# Patient Record
Sex: Male | Born: 2000
Health system: Southern US, Community
[De-identification: ages and names within clinical notes are randomized; demographics above are authoritative.]

## PROBLEM LIST (undated history)

## (undated) DIAGNOSIS — H6690 Otitis media, unspecified, unspecified ear: Secondary | ICD-10-CM

## (undated) HISTORY — DX: Otitis media, unspecified, unspecified ear: H66.90

## (undated) HISTORY — PX: FRACTURE SURGERY: SHX138

## (undated) HISTORY — PX: TYMPANOSTOMY TUBE PLACEMENT: SHX32

---
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
## 2018-06-27 ENCOUNTER — Emergency Department (HOSPITAL_COMMUNITY): Payer: 59

## 2018-06-27 ENCOUNTER — Encounter (HOSPITAL_COMMUNITY): Payer: Self-pay | Admitting: Emergency Medicine

## 2018-06-27 ENCOUNTER — Other Ambulatory Visit: Payer: Self-pay

## 2018-06-27 ENCOUNTER — Emergency Department (HOSPITAL_COMMUNITY)
Admission: EM | Admit: 2018-06-27 | Discharge: 2018-06-27 | Disposition: A | Discharge status: Home / Self Care | Payer: 59 | Attending: Emergency Medicine | Admitting: Emergency Medicine

## 2018-06-27 DIAGNOSIS — N200 Calculus of kidney: Secondary | ICD-10-CM | POA: Diagnosis not present

## 2018-06-27 DIAGNOSIS — R1011 Right upper quadrant pain: Secondary | ICD-10-CM | POA: Diagnosis present

## 2018-06-27 DIAGNOSIS — Z79899 Other long term (current) drug therapy: Secondary | ICD-10-CM | POA: Diagnosis not present

## 2018-06-27 DIAGNOSIS — F17200 Nicotine dependence, unspecified, uncomplicated: Secondary | ICD-10-CM | POA: Diagnosis not present

## 2018-06-27 LAB — COMPREHENSIVE METABOLIC PANEL
ALT: 14 U/L (ref 0–44)
AST: 18 U/L (ref 15–41)
Albumin: 5.1 g/dL — ABNORMAL HIGH (ref 3.5–5.0)
Alkaline Phosphatase: 98 U/L (ref 38–126)
Anion gap: 17 — ABNORMAL HIGH (ref 5–15)
BUN: 9 mg/dL (ref 6–20)
CO2: 23 mmol/L (ref 22–32)
Calcium: 9.3 mg/dL (ref 8.9–10.3)
Chloride: 97 mmol/L — ABNORMAL LOW (ref 98–111)
Creatinine, Ser: 1.09 mg/dL (ref 0.61–1.24)
GFR calc Af Amer: >60 mL/min (ref >60)
GFR calc non Af Amer: >60 mL/min (ref >60)
Glucose, Bld: 93 mg/dL (ref 70–99)
Potassium: 3.4 mmol/L — ABNORMAL LOW (ref 3.5–5.1)
Sodium: 137 mmol/L (ref 135–145)
Total Bilirubin: 1.3 mg/dL — ABNORMAL HIGH (ref 0.3–1.2)
Total Protein: 8 g/dL (ref 6.5–8.1)

## 2018-06-27 LAB — CBC WITH DIFFERENTIAL/PLATELET
Abs Immature Granulocytes: 0.08 10*3/uL — ABNORMAL HIGH (ref 0.00–0.07)
Basophils Absolute: 0 10*3/uL (ref 0.0–0.1)
Basophils Relative: 0 %
Eosinophils Absolute: 0.1 10*3/uL (ref 0.0–0.5)
Eosinophils Relative: 0 %
HCT: 47.1 % (ref 39.0–52.0)
Hemoglobin: 16.6 g/dL (ref 13.0–17.0)
Immature Granulocytes: 0 %
Lymphocytes Relative: 4 %
Lymphs Abs: 0.9 10*3/uL (ref 0.7–4.0)
MCH: 32.2 pg (ref 26.0–34.0)
MCHC: 35.2 g/dL (ref 30.0–36.0)
MCV: 91.3 fL (ref 80.0–100.0)
Monocytes Absolute: 1.4 10*3/uL — ABNORMAL HIGH (ref 0.1–1.0)
Monocytes Relative: 7 %
Neutro Abs: 17.1 10*3/uL — ABNORMAL HIGH (ref 1.7–7.7)
Neutrophils Relative %: 89 %
Platelets: 275 10*3/uL (ref 150–400)
RBC: 5.16 MIL/uL (ref 4.22–5.81)
RDW: 12.1 % (ref 11.5–15.5)
WBC: 19.6 10*3/uL — ABNORMAL HIGH (ref 4.0–10.5)
nRBC: 0 % (ref 0.0–0.2)

## 2018-06-27 LAB — URINALYSIS, ROUTINE W REFLEX MICROSCOPIC
Bilirubin Urine: NEGATIVE
Glucose, UA: NEGATIVE mg/dL
Ketones, ur: 80 mg/dL — AB
Nitrite: NEGATIVE
Protein, ur: 100 mg/dL — AB
RBC / HPF: >50 RBC/hpf — ABNORMAL HIGH (ref 0–5)
Specific Gravity, Urine: 1.024 (ref 1.005–1.030)
pH: 7 (ref 5.0–8.0)

## 2018-06-27 MED ORDER — HYDROCODONE-ACETAMINOPHEN 5-325 MG PO TABS: 1.0000 | ORAL_TABLET | Freq: Four times a day (QID) | ORAL | 0 refills | Status: AC | PRN

## 2018-06-27 MED ORDER — ONDANSETRON 4 MG PO TBDP: 4.0000 mg | ORAL_TABLET | Freq: Three times a day (TID) | ORAL | 0 refills | Status: AC | PRN

## 2018-06-27 MED ORDER — MORPHINE SULFATE (PF) 4 MG/ML IV SOLN
4.0000 mg | Freq: Once | INTRAVENOUS | Status: AC
  Administered 2018-06-27: 4 mg via INTRAVENOUS
  Filled 2018-06-27: qty 1

## 2018-06-27 MED ORDER — SULFAMETHOXAZOLE-TRIMETHOPRIM 800-160 MG PO TABS: 1.0000 | ORAL_TABLET | Freq: Two times a day (BID) | ORAL | 0 refills | Status: AC

## 2018-06-27 MED ORDER — SODIUM CHLORIDE 0.9 % IV BOLUS
1000.0000 mL | Freq: Once | INTRAVENOUS | Status: AC
  Administered 2018-06-27: 1000 mL via INTRAVENOUS

## 2018-06-27 MED ORDER — KETOROLAC TROMETHAMINE 10 MG PO TABS: 10.0000 mg | ORAL_TABLET | Freq: Three times a day (TID) | ORAL | 0 refills | Status: AC | PRN

## 2018-06-27 MED ORDER — SODIUM CHLORIDE 0.9 % IV SOLN
2.0000 g | Freq: Once | INTRAVENOUS | Status: AC
  Administered 2018-06-27: 2 g via INTRAVENOUS
  Filled 2018-06-27: qty 20

## 2018-06-27 MED ORDER — KETOROLAC TROMETHAMINE 30 MG/ML IJ SOLN
15.0000 mg | Freq: Once | INTRAMUSCULAR | Status: AC
  Administered 2018-06-27: 15 mg via INTRAVENOUS
  Filled 2018-06-27: qty 1

## 2018-06-27 MED ORDER — ONDANSETRON HCL 4 MG/2ML IJ SOLN
4.0000 mg | Freq: Once | INTRAMUSCULAR | Status: AC
  Administered 2018-06-27: 4 mg via INTRAVENOUS
  Filled 2018-06-27: qty 2

## 2018-06-27 MED ORDER — TAMSULOSIN HCL 0.4 MG PO CAPS: 0.4000 mg | ORAL_CAPSULE | Freq: Every day | ORAL | 0 refills | Status: AC

## 2018-06-27 MED ORDER — KETOROLAC TROMETHAMINE 30 MG/ML IJ SOLN
30.0000 mg | Freq: Once | INTRAMUSCULAR | Status: AC
  Administered 2018-06-27: 30 mg via INTRAVENOUS
  Filled 2018-06-27: qty 1

## 2018-06-27 NOTE — ED Triage Notes (Signed)
Dx 'd with kidney stone at Urgent care in [place]  Told to follow with Dr Wolfgang Phoenix  Has not now with L flank pain  Here for eval

## 2018-06-27 NOTE — ED Provider Notes (Signed)
[facility] EMERGENCY DEPARTMENT Provider Note   CSN: [id number] Arrival date & time: 06/27/18  1311    History   Chief Complaint Chief Complaint  Patient presents with   Flank Pain    HPI Randy HumphreyWesley K Williams is a 18 y.o. male past medical depression, chronic otitis media who presents for evaluation of right flank pain that began this morning.  Patient reports that he was down in [place] 2 weeks ago and was told he had a right-sided kidney stone.  He states that he was supposed to get results and was going to follow-up but he states he never got the results and did not follow-up with anyone.  Patient states that he had been doing well until this morning when he started having acute onset of right flank pain.  ED arrival, he states that pain is now more in the right lower abdomen.  He reports some associated nausea/vomiting.  He has not noted any hematuria or dysuria.  He has not noted any fever.  She denies any difficulty breathing, testicular pain or swelling.      The history is provided by the patient.    Past Medical History:  Diagnosis Date   Chronic otitis media     Patient Active Problem List   Diagnosis Date Noted   Depression 03/05/2013   Other malaise and fatigue 03/05/2013   Insomnia 03/05/2013    Past Surgical History:  Procedure Laterality Date   FRACTURE SURGERY     TYMPANOSTOMY TUBE PLACEMENT          Home Medications    Prior to Admission medications   Medication Sig Start Date End Date Taking? Authorizing Provider  amphetamine-dextroamphetamine (ADDERALL) 10 MG tablet Take 10 mg by mouth every morning. 05/08/18  Yes [provider]  citalopram (CELEXA) 20 MG tablet Take 20 mg by mouth at bedtime.  09/28/16  Yes [provider]  ibuprofen (ADVIL) 200 MG tablet Take 400 mg by mouth every 6 (six) hours as needed for mild pain or moderate pain.   Yes [provider]  HYDROcodone-acetaminophen (NORCO/VICODIN) 5-325 MG  tablet Take 1-2 tablets by mouth every 6 (six) hours as needed. 06/27/18   [name], [name], PA-C  ketorolac (TORADOL) 10 MG tablet Take 1 tablet (10 mg total) by mouth every 8 (eight) hours as needed. 06/27/18   [name], Annisten Manchester A, PA-C  ondansetron (ZOFRAN ODT) 4 MG disintegrating tablet Take 1 tablet (4 mg total) by mouth every 8 (eight) hours as needed for nausea or vomiting. 06/27/18   [name], [name], PA-C  sulfamethoxazole-trimethoprim (BACTRIM DS) 800-160 MG tablet Take 1 tablet by mouth 2 (two) times daily for 7 days. 06/27/18 07/04/18  [name], [name], PA-C  tamsulosin (FLOMAX) 0.4 MG CAPS capsule Take 1 capsule (0.4 mg total) by mouth daily for 5 days. 06/27/18 07/02/18  [name], [name], PA-C    Family History Family History  Problem Relation Age of Onset   Heart attack Other    Depression Other    Heart attack Other    Depression Mother    Alcohol abuse Father    Anxiety disorder Brother    Alcohol abuse Paternal Aunt    Depression Maternal Grandmother    Alcohol abuse Paternal Grandfather     Social History Social History   Tobacco Use   Smoking status: Current Every Day Smoker   Smokeless tobacco: Never Used  Substance Use Topics   Alcohol use: No   Drug use: No  Allergies   Patient has no known allergies.   Review of Systems Review of Systems  Constitutional: Negative for fever.  Respiratory: Negative for cough and shortness of breath.   Cardiovascular: Negative for chest pain.  Gastrointestinal: Positive for abdominal pain, nausea and vomiting.  Genitourinary: Positive for flank pain. Negative for dysuria and hematuria.  Neurological: Negative for headaches.  All other systems reviewed and are negative.    Physical Exam Updated Vital Signs BP (!) 121/91 (BP Location: Right Arm)    Pulse 76    Temp 98.3 F (36.8 C) (Oral)    Resp 20    Ht 5\' 8"  (1.727 m)    Wt 51.7 kg    SpO2 97%    BMI 17.33 kg/m   Physical Exam Vitals signs and  nursing note reviewed.  Constitutional:      Appearance: Normal appearance. He is well-developed.     Comments: Appears uncomfortable, actively vomiting.   HENT:     Head: Normocephalic and atraumatic.  Eyes:     General: Lids are normal.     Conjunctiva/sclera: Conjunctivae normal.     Pupils: Pupils are equal, round, and reactive to light.  Neck:     Musculoskeletal: Full passive range of motion without pain.  Cardiovascular:     Rate and Rhythm: Normal rate and regular rhythm.     Pulses: Normal pulses.     Heart sounds: Normal heart sounds. No murmur. No friction rub. No gallop.   Pulmonary:     Effort: Pulmonary effort is normal.     Breath sounds: Normal breath sounds.     Comments: Lungs clear to auscultation bilaterally.  Symmetric chest rise.  No wheezing, rales, rhonchi. Abdominal:     Palpations: Abdomen is soft. Abdomen is not rigid.     Tenderness: There is abdominal tenderness in the right lower quadrant. There is no guarding.     Comments: Abdomen is soft, nondistended.  Diffuse tenderness palpation noted to right lower quadrant with no focal point.  No tenderness noted at McBurney's point.  Musculoskeletal: Normal range of motion.  Skin:    General: Skin is warm and dry.     Capillary Refill: Capillary refill takes less than 2 seconds.  Neurological:     Mental Status: He is alert and oriented to person, place, and time.  Psychiatric:        Speech: Speech normal.      ED Treatments / Results  Labs (all labs ordered are listed, but only abnormal results are displayed) Labs Reviewed  URINALYSIS, ROUTINE W REFLEX MICROSCOPIC - Abnormal; Notable for the following components:      Result Value   APPearance CLOUDY (*)    Hgb urine dipstick MODERATE (*)    Ketones, ur 80 (*)    Protein, ur 100 (*)    Leukocytes,Ua TRACE (*)    RBC / HPF >50 (*)    Bacteria, UA RARE (*)    All other components within normal limits  COMPREHENSIVE METABOLIC PANEL - Abnormal;  Notable for the following components:   Potassium 3.4 (*)    Chloride 97 (*)    Albumin 5.1 (*)    Total Bilirubin 1.3 (*)    Anion gap 17 (*)    All other components within normal limits  CBC WITH DIFFERENTIAL/PLATELET - Abnormal; Notable for the following components:   WBC 19.6 (*)    Neutro Abs 17.1 (*)    Monocytes Absolute 1.4 (*)  Abs Immature Granulocytes 0.08 (*)    All other components within normal limits  URINE CULTURE    EKG None  Radiology Ct Renal Stone Study  Result Date: 06/27/2018 CLINICAL DATA:  Right-sided kidney stone by history with hematuria and right-sided pain, initial encounter EXAM: CT ABDOMEN AND PELVIS WITHOUT CONTRAST TECHNIQUE: Multidetector CT imaging of the abdomen and pelvis was performed following the standard protocol without IV contrast. COMPARISON:  None. FINDINGS: Lower chest: No acute abnormality. Hepatobiliary: No focal liver abnormality is seen. No gallstones, gallbladder wall thickening, or biliary dilatation. Pancreas: Unremarkable. No pancreatic ductal dilatation or surrounding inflammatory changes. Spleen: Normal in size without focal abnormality. Adrenals/Urinary Tract: Adrenal glands are within normal limits. The left kidney is unremarkable. No obstructive changes are seen. The bladder is decompressed. Right kidney demonstrates hydronephrosis and hydroureter which extends to the level of the right distal ureter where a 4 mm stone is identified causing the hydronephrosis. Stomach/Bowel: Stomach is within normal limits. Appendix appears normal. No evidence of bowel wall thickening, distention, or inflammatory changes. Vascular/Lymphatic: No significant vascular findings are present. No enlarged abdominal or pelvic lymph nodes. Reproductive: Prostate is unremarkable. Other: No abdominal wall hernia or abnormality. No abdominopelvic ascites. Musculoskeletal: No acute or significant osseous findings. IMPRESSION: 4 mm distal right ureteral stone causing  hydronephrosis and hydroureter. No other focal abnormality is noted. Electronically Signed   By: Inez Williams M.D.   On: 06/27/2018 15:13    Procedures Procedures (including critical care time)  Medications Ordered in ED Medications  ondansetron (ZOFRAN) injection 4 mg (4 mg Intravenous Given 06/27/18 1449)  morphine 4 MG/ML injection 4 mg (4 mg Intravenous Given 06/27/18 1448)  ketorolac (TORADOL) 30 MG/ML injection 30 mg (30 mg Intravenous Given 06/27/18 1635)  sodium chloride 0.9 % bolus 1,000 mL (1,000 mLs Intravenous New Bag/Given 06/27/18 1635)  ketorolac (TORADOL) 30 MG/ML injection 15 mg (15 mg Intravenous Given 06/27/18 1833)  cefTRIAXone (ROCEPHIN) 2 g in sodium chloride 0.9 % 100 mL IVPB (2 g Intravenous New Bag/Given 06/27/18 1834)     Initial Impression / Assessment and Plan / ED Course  I have reviewed the triage vital signs and the nursing notes.  Pertinent labs & imaging results that were available during my care of the patient were reviewed by me and considered in my medical decision making (see chart for details).        18 year old male who presents for evaluation of right abdominal pain that began today.  Recently diagnosed with kidney stones 2 weeks ago but has not followed up.  States he had been doing well until today when symptoms returned.  Additionally is having some nausea/vomiting.  No hematuria or dysuria that he has noted. Patient is afebrile, signs stable.  He does appear uncomfortable and is actively vomiting on exam.  Patient with diffuse tenderness noted to the right lower quadrant no focal point.  Concern for kidney stone.  Also consider appendicitis but lower suspicion given acute onset of symptoms.  Will plan for labs, UA, renal study.  CBC shows leukocytosis of 19.6.  CT renal study shows 4 mm distal right ureteral stone causing hydronephrosis and hydroureter which extends to the level of the right distal ureter. UA shows moderate Hgb, trace leuks, pyuria. Given  concerns for possible infected stone, will touch base with Urology.   Discussed patient with Dr. Diona Fanti (Urology). Recommends urine culture and treating with bactrim bid. Given patient's reassuring vitals and improvement in pain, feels that patient is ok  to discharge. Plan to follow-up with Urology tomorrow.   Updated patient on plan. He is agreeable. Reports pain is 4/10. Repeat abdominal exam is improved. He has been able to tolerate PO in the ED. No vomiting. At this time, patient exhibits no emergent life-threatening condition that require further evaluation in ED or admission. Discussed patient with Dr. Particia NearingHaviland who is agreeable.  Plan to send home with Zofran, Flomax.  Additionally, will give a short course of pain medication given acute nature of kidney stone.  Patient reviewed on PMP with no recent narcotic prescriptions.  Patient had ample opportunity for questions and discussion. All patient's questions were answered with full understanding. Strict return precautions discussed. Patient expresses understanding and agreement to plan.   Portions of this note were generated with Scientist, clinical (histocompatibility and immunogenetics)Dragon dictation software. Dictation errors may occur despite best attempts at proofreading.   Final Clinical Impressions(s) / ED Diagnoses   Final diagnoses:  Kidney stone    ED Discharge Orders         Ordered    ondansetron (ZOFRAN ODT) 4 MG disintegrating tablet  Every 8 hours PRN     06/27/18 1952    HYDROcodone-acetaminophen (NORCO/VICODIN) 5-325 MG tablet  Every 6 hours PRN     06/27/18 1952    ketorolac (TORADOL) 10 MG tablet  Every 8 hours PRN     06/27/18 1952    tamsulosin (FLOMAX) 0.4 MG CAPS capsule  Daily     06/27/18 1952    sulfamethoxazole-trimethoprim (BACTRIM DS) 800-160 MG tablet  2 times daily     06/27/18 1952           Rosana HoesLayden, Dajae Kizer A, PA-C 06/27/18 Azucena Fallen1958    Haviland, Julie, MD 06/27/18 2059

## 2018-06-27 NOTE — Discharge Instructions (Addendum)
Take Zofran as needed for nausea.  Take Toradol for pain.  You can also take Norco for severe breakthrough pain.  Use Flomax as directed.  Take antibiotics as directed. Please take all of your antibiotics until finished.  As we discussed, it is very important for you to follow-up with referred urologist.  Please call their office tomorrow morning.  They will plan to see you in [facility] tomorrow.  Return the emergency department for any fever, worsening pain, vomiting, blood in stools, vomiting blood or any other worsening or concerning symptoms.

## 2018-06-29 LAB — URINE CULTURE
Culture: NO GROWTH
Special Requests: NORMAL
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
## 2019-02-15 ENCOUNTER — Encounter: Payer: Self-pay | Admitting: Family Medicine

## 2019-02-16 ENCOUNTER — Encounter: Payer: Self-pay | Admitting: Family Medicine
